# Patient Record
Sex: Female | Born: 1965 | Hispanic: Yes | Smoking: Never smoker
Health system: Southern US, Community
[De-identification: ages and names within clinical notes are randomized; demographics above are authoritative.]

## PROBLEM LIST (undated history)

## (undated) DIAGNOSIS — E7800 Pure hypercholesterolemia, unspecified: Secondary | ICD-10-CM

## (undated) DIAGNOSIS — E119 Type 2 diabetes mellitus without complications: Secondary | ICD-10-CM

---

## 2019-11-18 ENCOUNTER — Other Ambulatory Visit: Payer: Self-pay | Admitting: *Deleted

## 2019-11-18 DIAGNOSIS — Z1231 Encounter for screening mammogram for malignant neoplasm of breast: Secondary | ICD-10-CM

## 2019-12-05 ENCOUNTER — Ambulatory Visit
Admission: RE | Admit: 2019-12-05 | Discharge: 2019-12-05 | Disposition: A | Discharge status: Home / Self Care | Payer: No Typology Code available for payment source | Source: Ambulatory Visit | Attending: Student | Admitting: Student

## 2019-12-05 ENCOUNTER — Other Ambulatory Visit: Payer: Self-pay

## 2019-12-05 DIAGNOSIS — Z1231 Encounter for screening mammogram for malignant neoplasm of breast: Secondary | ICD-10-CM

## 2020-02-15 ENCOUNTER — Other Ambulatory Visit: Payer: Self-pay

## 2020-02-15 ENCOUNTER — Emergency Department (HOSPITAL_COMMUNITY): Payer: No Typology Code available for payment source

## 2020-02-15 ENCOUNTER — Emergency Department (HOSPITAL_COMMUNITY)
Admission: EM | Admit: 2020-02-15 | Discharge: 2020-02-15 | Disposition: A | Discharge status: Home / Self Care | Payer: No Typology Code available for payment source | Attending: Emergency Medicine | Admitting: Emergency Medicine

## 2020-02-15 ENCOUNTER — Encounter (HOSPITAL_COMMUNITY): Payer: Self-pay | Admitting: Emergency Medicine

## 2020-02-15 DIAGNOSIS — R059 Cough, unspecified: Secondary | ICD-10-CM | POA: Insufficient documentation

## 2020-02-15 DIAGNOSIS — R140 Abdominal distension (gaseous): Secondary | ICD-10-CM | POA: Insufficient documentation

## 2020-02-15 DIAGNOSIS — R079 Chest pain, unspecified: Secondary | ICD-10-CM | POA: Insufficient documentation

## 2020-02-15 DIAGNOSIS — E119 Type 2 diabetes mellitus without complications: Secondary | ICD-10-CM | POA: Insufficient documentation

## 2020-02-15 HISTORY — DX: Pure hypercholesterolemia, unspecified: E78.00

## 2020-02-15 HISTORY — DX: Type 2 diabetes mellitus without complications: E11.9

## 2020-02-15 LAB — TROPONIN I (HIGH SENSITIVITY)
Troponin I (High Sensitivity): <2 ng/L (ref <18)
Troponin I (High Sensitivity): 3 ng/L (ref <18)

## 2020-02-15 LAB — CBC
HCT: 39.7 % (ref 36.0–46.0)
Hemoglobin: 12.7 g/dL (ref 12.0–15.0)
MCH: 30 pg (ref 26.0–34.0)
MCHC: 32 g/dL (ref 30.0–36.0)
MCV: 93.9 fL (ref 80.0–100.0)
Platelets: 255 10*3/uL (ref 150–400)
RBC: 4.23 MIL/uL (ref 3.87–5.11)
RDW: 12.9 % (ref 11.5–15.5)
WBC: 6.1 10*3/uL (ref 4.0–10.5)
nRBC: 0 % (ref 0.0–0.2)

## 2020-02-15 LAB — CBG MONITORING, ED: Glucose-Capillary: 106 mg/dL — ABNORMAL HIGH (ref 70–99)

## 2020-02-15 LAB — BASIC METABOLIC PANEL
Anion gap: 12 (ref 5–15)
BUN: 9 mg/dL (ref 6–20)
CO2: 21 mmol/L — ABNORMAL LOW (ref 22–32)
Calcium: 8.7 mg/dL — ABNORMAL LOW (ref 8.9–10.3)
Chloride: 103 mmol/L (ref 98–111)
Creatinine, Ser: 0.54 mg/dL (ref 0.44–1.00)
GFR, Estimated: >60 mL/min (ref >60)
Glucose, Bld: 114 mg/dL — ABNORMAL HIGH (ref 70–99)
Potassium: 3.7 mmol/L (ref 3.5–5.1)
Sodium: 136 mmol/L (ref 135–145)

## 2020-02-15 LAB — I-STAT BETA HCG BLOOD, ED (MC, WL, AP ONLY): I-stat hCG, quantitative: <5 m[IU]/mL (ref <5)

## 2020-02-15 LAB — D-DIMER, QUANTITATIVE: D-Dimer, Quant: 0.3 ug/mL-FEU (ref 0.00–0.50)

## 2020-02-15 MED ORDER — ACETAMINOPHEN 500 MG PO TABS
1000.0000 mg | ORAL_TABLET | Freq: Once | ORAL | Status: AC
  Administered 2020-02-15: 1000 mg via ORAL
  Filled 2020-02-15: qty 2

## 2020-02-15 NOTE — ED Notes (Signed)
Patient verbalizes understanding of discharge instructions. Opportunity for questioning and answers were provided. Armband removed by staff, pt discharged from ED and ambulated to lobby to go home with family.   

## 2020-02-15 NOTE — ED Triage Notes (Signed)
Pt reports tightness to center and L chest since yesterday morning with SOB that gradually increased.  Unable to sleep due to pain.  Denies nausea, vomiting, diaphoresis.  States she has increased thirst.  History of diabetes.

## 2020-02-15 NOTE — Discharge Instructions (Signed)
The blood test for the heart and blood clots were all within normal limits today.  Chest x-ray was normal without signs of pneumonia.  Red blood cell count, white blood cell count and kidney function were all normal.  Blood test to the heart were normal without signs of heart infection or heart attack.  Continue taking ibuprofen and Tylenol for pain.  Also lidocaine patch may be helpful.  This may be coming from the vaccine and you should be better in the next few days.  However if symptoms start to worsen you start running a fever having significant shortness of breath, vomiting or other concerns return to the emergency room or see your doctor.

## 2020-02-15 NOTE — ED Provider Notes (Signed)
[facility] EMERGENCY DEPARTMENT Provider Note   CSN: [id number] Arrival date & time: 02/15/20  1008     History Chief Complaint  Patient presents with  . Chest Pain    Susan Obrien is a 54 y.o. female.  The history is provided by the patient.  Chest Pain Pain location:  L chest Pain quality: aching and shooting   Pain radiates to:  Upper back Pain severity:  Moderate Onset quality:  Gradual Duration:  1 day Timing:  Constant Progression:  Worsening Chronicity:  New Context comment:  She did receive her Covid vaccine 2 days ago but otherwise has been in her normal state of health until yesterday when the symptoms started Relieved by: Some improvement with ibuprofen. Exacerbated by: Seems to be the worst at rest and better with walking and moving around. Ineffective treatments:  None tried Associated symptoms: no palpitations, no shortness of breath, no vomiting and no weakness   Associated symptoms comment:  Minimal cough and a little bit of abdominal bloating but no vomiting, nausea or diarrhea.  No shortness of breath.  Patient did travel here from [place] by plane at the beginning of November.  She has not had any unilateral leg pain or swelling.  No history of blood clots or family history.  She did receive her Covid booster on Friday Risk factors: diabetes mellitus   Risk factors: no coronary artery disease        Past Medical History:  Diagnosis Date  . Diabetes mellitus without complication (HCC)   . High cholesterol     There are no problems to display for this patient.   History reviewed. No pertinent surgical history.   OB History   No obstetric history on file.     No family history on file.  Social History   Tobacco Use  . Smoking status: Never Smoker  . Smokeless tobacco: Never Used  Substance Use Topics  . Alcohol use: Never  . Drug use: Never    Home Medications Prior to Admission medications   Not on File     Allergies    Patient has no allergy information on record.  Review of Systems   Review of Systems  Respiratory: Negative for shortness of breath.   Cardiovascular: Positive for chest pain. Negative for palpitations.  Gastrointestinal: Negative for vomiting.  Neurological: Negative for weakness.  All other systems reviewed and are negative.   Physical Exam Updated Vital Signs BP (!) 161/91 (BP Location: Right Arm)   Pulse 64   Temp 98.6 F (37 C) (Oral)   Resp 17   Ht 5\' 1"  (1.549 m)   Wt 51.7 kg   SpO2 99%   BMI 21.54 kg/m   Physical Exam Vitals and nursing note reviewed.  Constitutional:      General: She is not in acute distress.    Appearance: She is well-developed and normal weight.  HENT:     Head: Normocephalic and atraumatic.  Eyes:     Pupils: Pupils are equal, round, and reactive to light.  Cardiovascular:     Rate and Rhythm: Normal rate and regular rhythm.     Pulses: Normal pulses.     Heart sounds: Normal heart sounds. No murmur heard.  No friction rub.  Pulmonary:     Effort: Pulmonary effort is normal.     Breath sounds: Normal breath sounds. No wheezing or rales.  Chest:     Chest wall: No tenderness.  Abdominal:  General: Bowel sounds are normal. There is no distension.     Palpations: Abdomen is soft.     Tenderness: There is no abdominal tenderness. There is no guarding or rebound.  Musculoskeletal:        General: No tenderness. Normal range of motion.     Comments: No edema  Skin:    General: Skin is warm and dry.     Findings: No rash.  Neurological:     General: No focal deficit present.     Mental Status: She is alert and oriented to person, place, and time. Mental status is at baseline.     Cranial Nerves: No cranial nerve deficit.  Psychiatric:        Mood and Affect: Mood normal.        Behavior: Behavior normal.        Thought Content: Thought content normal.     ED Results / Procedures / Treatments   Labs (all  labs ordered are listed, but only abnormal results are displayed) Labs Reviewed  BASIC METABOLIC PANEL - Abnormal; Notable for the following components:      Result Value   CO2 21 (*)    Glucose, Bld 114 (*)    Calcium 8.7 (*)    All other components within normal limits  CBG MONITORING, ED - Abnormal; Notable for the following components:   Glucose-Capillary 106 (*)    All other components within normal limits  CBC  D-DIMER, QUANTITATIVE (NOT AT [facility])  I-STAT BETA HCG BLOOD, ED ([facility], [facility], [facility] ONLY)  TROPONIN I (HIGH SENSITIVITY)  TROPONIN I (HIGH SENSITIVITY)    EKG EKG Interpretation  Date/Time:  Sunday February 15 2020 10:10:58 EST Ventricular Rate:  74 PR Interval:  140 QRS Duration: 72 QT Interval:  398 QTC Calculation: 441 R Axis:   37 Text Interpretation: Sinus rhythm with Fusion complexes Otherwise normal ECG No previous tracing Confirmed by Gwyneth Sprout ([id number]) on 02/15/2020 11:21:40 AM   Radiology DG Chest 2 View  Result Date: 02/15/2020 CLINICAL DATA:  Chest pain EXAM: CHEST - 2 VIEW COMPARISON:  None. FINDINGS: The heart size and mediastinal contours are within normal limits. Both lungs are clear. The visualized skeletal structures are unremarkable. IMPRESSION: No active cardiopulmonary disease. Electronically Signed   By: Gerome Sam III M.D   On: 02/15/2020 11:18    Procedures Procedures (including critical care time)  Medications Ordered in ED Medications  acetaminophen (TYLENOL) tablet 1,000 mg (has no administration in time range)    ED Course  I have reviewed the triage vital signs and the nursing notes.  Pertinent labs & imaging results that were available during my care of the patient were reviewed by me and considered in my medical decision making (see chart for details).    MDM Rules/Calculators/A&P                          Patient is a 54 year old female presenting today with nonspecific left chest pain.  She also feels it in her left  scapular region.  It is improved with exertion and worse at rest.  This did start after having her Covid booster 2 days ago.  She has no other significant symptoms.  She has been afebrile and has not had any Covid contacts that she is aware of.  She has no prior heart disease and does not use tobacco products.  She is well-appearing on exam with normal vital signs except for  hypertension.  She has had recent travel from [place] but they were both fairly short plane flights and she has no prior history of PE and no family history.  She has no unilateral leg pain or swelling and she is not tachycardic.  Patient's EKG shows no significant findings for pericarditis, low risk Wells criteria and D-dimer is pending, CBC without acute findings and troponin is less than 2.  Lower suspicion for myocarditis at this time and ACS.  Chest x-ray without acute findings.  Patient symptoms may be musculoskeletal could also be related to the vaccine.  Ibuprofen does make the symptoms better but she is still having pain currently and will give a dose of Tylenol.  There is no evidence of shingles.  1:56 PM Delta troponins within normal limits, D-dimer within normal limits, BMP without acute findings.  Discussed findings with patient and her daughter.  She will continue ibuprofen, Tylenol and lidocaine patch.  Patient given return precautions.  MDM Number of Diagnoses or Management Options   Amount and/or Complexity of Data Reviewed Clinical lab tests: ordered and reviewed Tests in the radiology section of CPT: ordered and reviewed Tests in the medicine section of CPT: ordered and reviewed Decide to obtain previous medical records or to obtain history from someone other than the patient: yes Obtain history from someone other than the patient: no Review and summarize past medical records: yes Discuss the patient with other providers: no Independent visualization of images, tracings, or specimens: yes  Risk of  Complications, Morbidity, and/or Mortality Presenting problems: moderate Diagnostic procedures: low Management options: low  Patient Progress Patient progress: stable   Final Clinical Impression(s) / ED Diagnoses Final diagnoses:  Nonspecific chest pain    Rx / DC Orders ED Discharge Orders    None       Gwyneth Sprout, MD 02/15/20 1410

## 2021-06-28 IMAGING — CR DG CHEST 2V
2 series · 2 of 2 positions shown · non-contrast
Comparison: None.

CLINICAL DATA: Chest pain

EXAM:
CHEST - 2 VIEW

[chest pa]
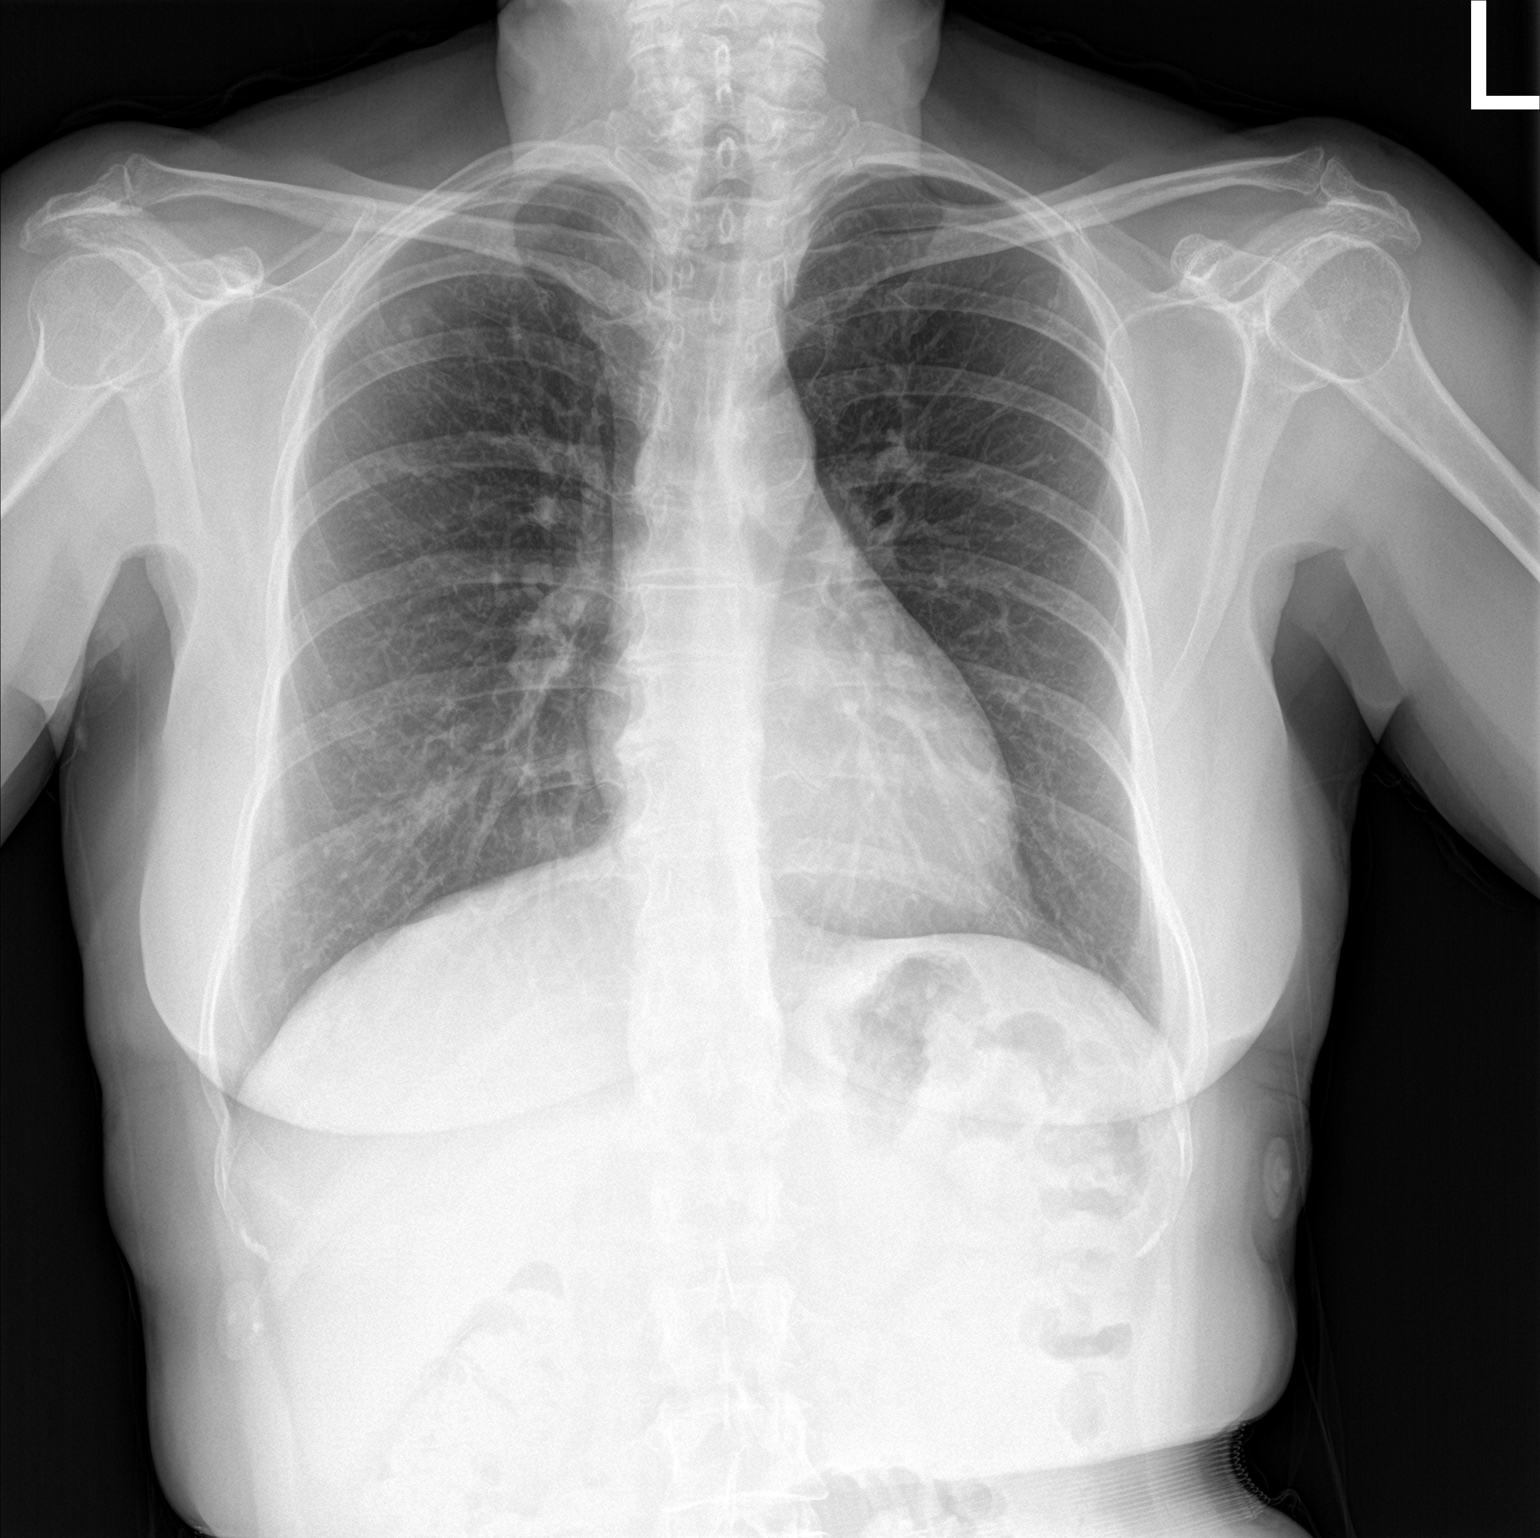

[chest lat]
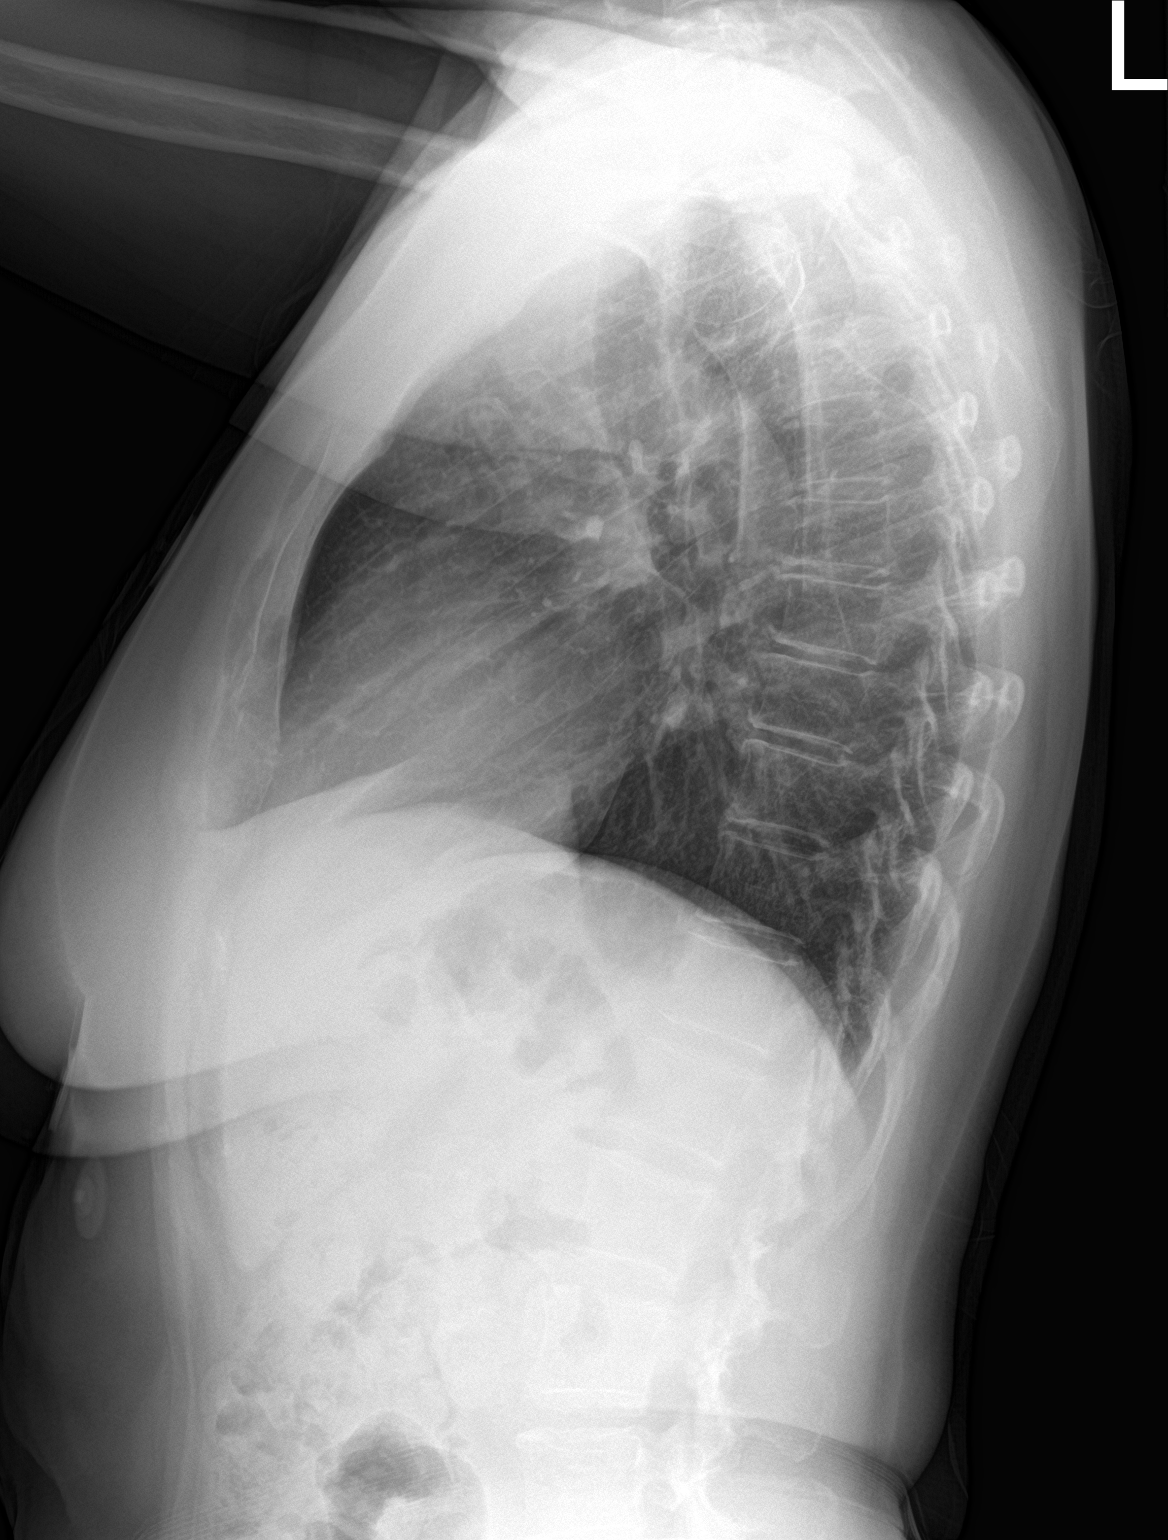

[2 of 2 positions shown; findings below may reference images not displayed]

FINDINGS: The heart size and mediastinal contours are within normal limits.
Both lungs are clear. The visualized skeletal structures are
unremarkable.
IMPRESSION: No active cardiopulmonary disease.

## 2022-11-21 ENCOUNTER — Telehealth: Payer: Self-pay

## 2022-11-21 NOTE — Telephone Encounter (Signed)
Attempted to call patient using interpreter, Viviana Simpler. Voice mail full. [facility] (scholarship)
